# Patient Record
Sex: Male | Born: 1985 | Race: White | Hispanic: No | Marital: Single | Smoking: Never smoker
Health system: Southern US, Community
[De-identification: ages and names within clinical notes are randomized; demographics above are authoritative.]

---
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
## 2006-09-17 ENCOUNTER — Ambulatory Visit: Payer: Self-pay | Admitting: Internal Medicine

## 2007-01-09 ENCOUNTER — Emergency Department: Payer: Self-pay | Admitting: Emergency Medicine

## 2008-01-22 ENCOUNTER — Ambulatory Visit: Payer: Self-pay | Admitting: Internal Medicine

## 2008-03-13 ENCOUNTER — Ambulatory Visit: Payer: Self-pay | Admitting: Internal Medicine

## 2011-09-01 ENCOUNTER — Ambulatory Visit: Payer: Self-pay | Admitting: Internal Medicine

## 2012-01-30 ENCOUNTER — Ambulatory Visit: Payer: Self-pay | Admitting: Family Medicine

## 2012-02-09 ENCOUNTER — Ambulatory Visit: Payer: Self-pay | Admitting: Family Medicine

## 2014-01-31 ENCOUNTER — Ambulatory Visit: Payer: Self-pay

## 2014-01-31 LAB — RAPID INFLUENZA A&B ANTIGENS

## 2015-05-21 ENCOUNTER — Emergency Department
Admission: EM | Admit: 2015-05-21 | Discharge: 2015-05-21 | Disposition: A | Discharge status: Home / Self Care | Payer: Worker's Compensation | Attending: Emergency Medicine | Admitting: Emergency Medicine

## 2015-05-21 ENCOUNTER — Emergency Department: Payer: Worker's Compensation

## 2015-05-21 ENCOUNTER — Encounter: Payer: Self-pay | Admitting: Emergency Medicine

## 2015-05-21 DIAGNOSIS — W25XXXA Contact with sharp glass, initial encounter: Secondary | ICD-10-CM | POA: Diagnosis not present

## 2015-05-21 DIAGNOSIS — S61422A Laceration with foreign body of left hand, initial encounter: Secondary | ICD-10-CM | POA: Insufficient documentation

## 2015-05-21 DIAGNOSIS — M795 Residual foreign body in soft tissue: Secondary | ICD-10-CM

## 2015-05-21 DIAGNOSIS — Y9389 Activity, other specified: Secondary | ICD-10-CM | POA: Insufficient documentation

## 2015-05-21 DIAGNOSIS — Y929 Unspecified place or not applicable: Secondary | ICD-10-CM | POA: Diagnosis not present

## 2015-05-21 DIAGNOSIS — IMO0002 Reserved for concepts with insufficient information to code with codable children: Secondary | ICD-10-CM

## 2015-05-21 DIAGNOSIS — Y990 Civilian activity done for income or pay: Secondary | ICD-10-CM | POA: Diagnosis not present

## 2015-05-21 MED ORDER — BACITRACIN ZINC 500 UNIT/GM EX OINT
TOPICAL_OINTMENT | CUTANEOUS | Status: AC
  Filled 2015-05-21: qty 0.9

## 2015-05-21 MED ORDER — CEPHALEXIN 500 MG PO CAPS
500.0000 mg | ORAL_CAPSULE | Freq: Four times a day (QID) | ORAL | Status: AC
Start: 2015-05-21 — End: 2015-05-31

## 2015-05-21 MED ORDER — LIDOCAINE-EPINEPHRINE (PF) 1 %-1:200000 IJ SOLN
INTRAMUSCULAR | Status: AC
  Filled 2015-05-21: qty 30

## 2015-05-21 MED ORDER — MUPIROCIN 2 % EX OINT: TOPICAL_OINTMENT | CUTANEOUS | Status: DC

## 2015-05-21 NOTE — Discharge Instructions (Signed)

## 2015-05-21 NOTE — ED Notes (Signed)
Pt verbalized understanding of discharge instructions. NAD at this time. 

## 2015-05-21 NOTE — ED Provider Notes (Signed)
[facility] Emergency Department Provider Note     Time seen: ----------------------------------------- 8:57 AM on 05/21/2015 -----------------------------------------    I have reviewed the triage vital signs and the nursing notes.   HISTORY  Chief Complaint No chief complaint on file.    HPI Terry Rodriguez is a 30 y.o. male who presents the ER after sustaining a laceration during police activity. Patient states he was breaching a door which caused the door to shatter and glass struck him in the left hand. Last was noted to be sticking out of the hand earlier, EMS would not remove at the scene. He presents with bleeding from the left hand and obvious glass foreign body. Patient is only complaining of mild pain.   No past medical history on file.  There are no active problems to display for this patient.   No past surgical history on file.  Allergies Review of patient's allergies indicates not on file.  Social History Social History  Substance Use Topics  . Smoking status: Not on file  . Smokeless tobacco: Not on file  . Alcohol Use: Not on file    Review of Systems Constitutional: Negative for fever. Musculoskeletal: Positive for left hand pain Skin: Positive for left hand laceration Neurological: Positive for paresthesias dorsally in the left hand  ____________________________________________   PHYSICAL EXAM:  VITAL SIGNS: ED Triage Vitals  Enc Vitals Group     BP --      Pulse --      Resp --      Temp --      Temp src --      SpO2 --      Weight --      Height --      Head Cir --      Peak Flow --      Pain Score --      Pain Loc --      Pain Edu? --      Excl. in GC? --     Constitutional: Alert and oriented. Well appearing and in no distress. Musculoskeletal: Obvious laceration with foreign body present in a 3 cm laceration dorso-laterally in the left hand. Neurologic:  Paresthesias distally to the wound.  Normal motor function is appreciated Skin:  3 cm laceration is noted dorsally in the left hand. Psychiatric: Mood and affect are normal. Speech and behavior are normal. Patient exhibits appropriate insight and judgment. ____________________________________________  ED COURSE:  Pertinent labs & imaging results that were available during my care of the patient were reviewed by me and considered in my medical decision making (see chart for details). Patient with left hand laceration informed body. Patient will require laceration repair informed body removal. LACERATION REPAIR Performed by: Emily Filbert Authorized by: Daryel November E Consent: Verbal consent obtained. Risks and benefits: risks, benefits and alternatives were discussed Consent given by: patient Patient identity confirmed: provided demographic data Prepped and Draped in normal sterile fashion Wound explored with a large piece of glass removed initially.  Laceration Location: Left hand  Laceration Length: 3 cm  No Foreign Bodies seen or palpated  Anesthesia: local infiltration  Local anesthetic: lidocaine 1 % with epinephrine  Anesthetic total: 3 ml  Irrigation method: syringe Amount of cleaning: standard  Skin closure: 5-0 Prolene   Number of sutures: 10   Technique: Interrupted   Patient tolerance: Patient tolerated the procedure well with no immediate complications.  ____________________________________________   RADIOLOGY Images were viewed by me  Left hand x-rays IMPRESSION: Soft tissue injury without radiopaque foreign body or acute osseous abnormality identified. ____________________________________________  FINAL ASSESSMENT AND PLAN  Laceration with repair, foreign body removal  Plan: Patient with imaging as dictated above. X-rays unremarkable, foreign body was removed without any further foreign bodies identified. He'll be discharged with Keflex and advised to use topical  antibiotic on it. He is stable for discharge. Advise suture removal in 7-10 days   Emily FilbertWilliams, Kikue Gerhart E, MD   Emily FilbertJonathan E Kennith Morss, MD 05/21/15 [phone]

## 2015-05-21 NOTE — ED Notes (Signed)
[place] [employer] [facility] [place] laceration to left hand.

## 2015-09-24 ENCOUNTER — Ambulatory Visit
Admission: EM | Admit: 2015-09-24 | Discharge: 2015-09-24 | Disposition: A | Discharge status: Home / Self Care | Payer: 59 | Attending: Family Medicine | Admitting: Family Medicine

## 2015-09-24 DIAGNOSIS — J0100 Acute maxillary sinusitis, unspecified: Secondary | ICD-10-CM | POA: Diagnosis not present

## 2015-09-24 MED ORDER — LORATADINE-PSEUDOEPHEDRINE ER 5-120 MG PO TB12: 1.0000 | ORAL_TABLET | Freq: Two times a day (BID) | ORAL | Status: DC

## 2015-09-24 NOTE — ED Notes (Signed)
Patient complains of sinus pain and pressure, with cough, fatigue and chills. Patient states that pressure radiates in to jaw and face. Patient states that symptoms started 3 days ago.

## 2015-09-24 NOTE — Discharge Instructions (Signed)
Take medication as prescribed. Rest. Drink plenty of fluids.  ° °Follow up with your primary care physician this week as needed. Return to Urgent care for new or worsening concerns.  ° ° °Sinusitis, Adult °Sinusitis is redness, soreness, and inflammation of the paranasal sinuses. Paranasal sinuses are air pockets within the bones of your face. They are located beneath your eyes, in the middle of your forehead, and above your eyes. In healthy paranasal sinuses, mucus is able to drain out, and air is able to circulate through them by way of your nose. However, when your paranasal sinuses are inflamed, mucus and air can become trapped. This can allow bacteria and other germs to grow and cause infection. °Sinusitis can develop quickly and last only a short time (acute) or continue over a long period (chronic). Sinusitis that lasts for more than 12 weeks is considered chronic. °CAUSES °Causes of sinusitis include: °· Allergies. °· Structural abnormalities, such as displacement of the cartilage that separates your nostrils (deviated septum), which can decrease the air flow through your nose and sinuses and affect sinus drainage. °· Functional abnormalities, such as when the small hairs (cilia) that line your sinuses and help remove mucus do not work properly or are not present. °SIGNS AND SYMPTOMS °Symptoms of acute and chronic sinusitis are the same. The primary symptoms are pain and pressure around the affected sinuses. Other symptoms include: °· Upper toothache. °· Earache. °· Headache. °· Bad breath. °· Decreased sense of smell and taste. °· A cough, which worsens when you are lying flat. °· Fatigue. °· Fever. °· Thick drainage from your nose, which often is green and may contain pus (purulent). °· Swelling and warmth over the affected sinuses. °DIAGNOSIS °Your health care provider will perform a physical exam. During your exam, your health care provider may perform any of the following to help determine if you have  acute sinusitis or chronic sinusitis: °· Look in your nose for signs of abnormal growths in your nostrils (nasal polyps). °· Tap over the affected sinus to check for signs of infection. °· View the inside of your sinuses using an imaging device that has a light attached (endoscope). °If your health care provider suspects that you have chronic sinusitis, one or more of the following tests may be recommended: °· Allergy tests. °· Nasal culture. A sample of mucus is taken from your nose, sent to a lab, and screened for bacteria. °· Nasal cytology. A sample of mucus is taken from your nose and examined by your health care provider to determine if your sinusitis is related to an allergy. °TREATMENT °Most cases of acute sinusitis are related to a viral infection and will resolve on their own within 10 days. Sometimes, medicines are prescribed to help relieve symptoms of both acute and chronic sinusitis. These may include pain medicines, decongestants, nasal steroid sprays, or saline sprays. °However, for sinusitis related to a bacterial infection, your health care provider will prescribe antibiotic medicines. These are medicines that will help kill the bacteria causing the infection. °Rarely, sinusitis is caused by a fungal infection. In these cases, your health care provider will prescribe antifungal medicine. °For some cases of chronic sinusitis, surgery is needed. Generally, these are cases in which sinusitis recurs more than 3 times per year, despite other treatments. °HOME CARE INSTRUCTIONS °· Drink plenty of water. Water helps thin the mucus so your sinuses can drain more easily. °· Use a humidifier. °· Inhale steam 3-4 times a day (for example, sit in   the bathroom with the shower running). °· Apply a warm, moist washcloth to your face 3-4 times a day, or as directed by your health care provider. °· Use saline nasal sprays to help moisten and clean your sinuses. °· Take medicines only as directed by your health care  provider. °· If you were prescribed either an antibiotic or antifungal medicine, finish it all even if you start to feel better. °SEEK IMMEDIATE MEDICAL CARE IF: °· You have increasing pain or severe headaches. °· You have nausea, vomiting, or drowsiness. °· You have swelling around your face. °· You have vision problems. °· You have a stiff neck. °· You have difficulty breathing. °  °This information is not intended to replace advice given to you by your health care provider. Make sure you discuss any questions you have with your health care provider. °  °Document Released: 02/20/2005 Document Revised: 03/13/2014 Document Reviewed: 03/07/2011 °Elsevier Interactive Patient Education ©2016 [employer] ° °

## 2015-09-24 NOTE — ED Provider Notes (Signed)
[facility]  ____________________________________________  Time seen: Approximately 9:01 AM  I have reviewed the triage vital signs and the nursing notes.   HISTORY  Chief Complaint Sinusitis  HPI Terry Rodriguez is a 30 y.o. male presents with a complaint of right maxillary sinus pressure 2-3 days. Patient states he had a mild runny nose that has gotten better. Patient reports mild watery eyes and occasional cough. Denies postnasal drainage. States sinus pressure is very mild discomfort, and describes as congested feeling. Patient states he was concerned that he had a sinus infection.  Reports continues to eat and drink well. Denies fevers. Denies vision changes. Denies dental pain. Reports 30-year-old sick at home with GI symptoms. Denies other known sick contacts. States has a newborn at home and frequently has to wake up with newborn. Denies recent sickness. Denies chest pain, fatigue, fevers, shortness of breath, abdominal pain, dysuria, dizziness, headache, neck pain, back pain, weakness.  PCP: [facility]    History reviewed. No pertinent past medical history.  There are no active problems to display for this patient.   Past Surgical History  Procedure Laterality Date  . Ankle surgery Left 2004    Current Outpatient Rx  Name  Route  Sig  Dispense  Refill               Allergies Review of patient's allergies indicates no known allergies.  History reviewed. No pertinent family history.  Social History Social History  Substance Use Topics  . Smoking status: Never Smoker   . Smokeless tobacco: None  . Alcohol Use: No    Review of Systems Constitutional: No fever/chills Eyes: No visual changes. ENT: No sore throat.As above. Denies ear complaints.  Cardiovascular: Denies chest pain. Respiratory: Denies shortness of breath. Gastrointestinal: No abdominal pain.  No nausea, no vomiting.  No diarrhea.  No constipation. Genitourinary: Negative for  dysuria. Musculoskeletal: Negative for back pain. Skin: Negative for rash. Neurological: Negative for headaches, focal weakness or numbness.  10-point ROS otherwise negative.  ____________________________________________   PHYSICAL EXAM:  VITAL SIGNS: ED Triage Vitals  Enc Vitals Group     BP 09/24/15 0856 123/68 mmHg     Pulse Rate 09/24/15 0856 76     Resp 09/24/15 0856 17     Temp 09/24/15 0856 97.6 F (36.4 C)     Temp Source 09/24/15 0856 Tympanic     SpO2 09/24/15 0856 100 %     Weight 09/24/15 0856 226 lb (102.513 kg)     Height 09/24/15 0856 5\' 7"  (1.702 m)     Head Cir --      Peak Flow --      Pain Score 09/24/15 0857 2     Pain Loc --      Pain Edu? --      Excl. in GC? --    Constitutional: Alert and oriented. Well appearing and in no acute distress. Eyes: Conjunctivae are normal. PERRL. EOMI. Head: Atraumatic. Minimal right maxillary sinus tenderness to palpation, otherwise no sinus tenderness to palpation. No swelling. No erythema.  Ears: no erythema, normal TMs bilaterally.   Nose: Mild nasal congestion and nasal mucosal edema.  Mouth/Throat: Mucous membranes are moist. No pharyngeal erythema. No tonsillar swelling or exudate. No dental tenderness to palpation, no gum swelling noted.  Neck: No stridor.  No cervical spine tenderness to palpation. Hematological/Lymphatic/Immunilogical: No cervical lymphadenopathy. Cardiovascular: Normal rate, regular rhythm. Grossly normal heart sounds.  Good peripheral circulation. Respiratory: Normal respiratory effort.  No retractions. Lungs CTAB.No wheezes, rales or rhonchi. Good air movement.  Gastrointestinal: Soft and nontender.  Musculoskeletal: No lower or upper extremity tenderness nor edema. No cervical, thoracic or lumbar tenderness to palpation. Neurologic:  Normal speech and language. No gross focal neurologic deficits are appreciated. No gait instability. Skin:  Skin is warm, dry and intact. No rash  noted. Psychiatric: Mood and affect are normal. Speech and behavior are normal.  ____________________________________________   LABS (all labs ordered are listed, but only abnormal results are displayed)  Labs Reviewed - No data to display   INITIAL IMPRESSION / ASSESSMENT AND PLAN / ED COURSE  Pertinent labs & imaging results that were available during my care of the patient were reviewed by me and considered in my medical decision making (see chart for details).  Very well-appearing patient. No acute distress. Present for the complaints of 2-3 days of some right maxillary sinus congestion feeling in intermittent runny nose and cough. Very well-appearing patient. Suspect viral infection. Encouraged supportive treatments and use of oral Claritin-D. Encouraged rest, fluids and PCP follow-up as needed.  Discussed follow up with Primary care physician this week. Discussed follow up and return parameters including no resolution or any worsening concerns. Patient verbalized understanding and agreed to plan.   ____________________________________________   FINAL CLINICAL IMPRESSION(S) / ED DIAGNOSES  Final diagnoses:  Acute maxillary sinusitis, recurrence not specified     Discharge Medication List as of 09/24/2015  9:06 AM    START taking these medications   Details  loratadine-pseudoephedrine (CLARITIN-D 12 HOUR) 5-120 MG tablet Take 1 tablet by mouth 2 (two) times daily., Starting 09/24/2015, Until Discontinued, Normal        Note: This dictation was prepared with Dragon dictation along with smaller phrase technology. Any transcriptional errors that result from this process are unintentional.       Renford Dills, NP 09/24/15 [phone]

## 2016-03-03 ENCOUNTER — Ambulatory Visit
Admission: EM | Admit: 2016-03-03 | Discharge: 2016-03-03 | Disposition: A | Discharge status: Home / Self Care | Payer: 59 | Attending: Family Medicine | Admitting: Family Medicine

## 2016-03-03 DIAGNOSIS — R6889 Other general symptoms and signs: Secondary | ICD-10-CM | POA: Diagnosis not present

## 2016-03-03 LAB — RAPID INFLUENZA A&B ANTIGENS (ARMC ONLY): INFLUENZA B (ARMC): NEGATIVE

## 2016-03-03 LAB — RAPID INFLUENZA A&B ANTIGENS: Influenza A (ARMC): NEGATIVE

## 2016-03-03 MED ORDER — OSELTAMIVIR PHOSPHATE 75 MG PO CAPS: 75.0000 mg | ORAL_CAPSULE | Freq: Two times a day (BID) | ORAL | 0 refills | Status: DC

## 2016-03-03 MED ORDER — FEXOFENADINE-PSEUDOEPHED ER 180-240 MG PO TB24: 1.0000 | ORAL_TABLET | Freq: Every day | ORAL | 0 refills | Status: DC

## 2016-03-03 MED ORDER — BENZONATATE 200 MG PO CAPS: 200.0000 mg | ORAL_CAPSULE | Freq: Three times a day (TID) | ORAL | 0 refills | Status: DC | PRN

## 2016-03-03 NOTE — ED Triage Notes (Signed)
Pt c/o sinus pressure, generalized body aches, ear pain, and fever over night.

## 2016-03-03 NOTE — ED Provider Notes (Signed)
[facility]    CSN: [id number] Arrival date & time: 03/03/16  1017     History   Chief Complaint Chief Complaint  Patient presents with  . Influenza    HPI Terry Rodriguez is a 30 y.o. male.   Patient's here because of flulike symptoms. He states everything started yesterday. Temperature 90.9. He did not get a flu shot this year. He reports was surgery was knee surgery. As a drug allergy to one of the anesthesia medications. He does not smoke. No sniffing family medical history present alone to today's visit. He reports aching all over some coughing and some congestion and some mild irritation of his throat.   The history is provided by the patient. No language interpreter was used.  Influenza  Presenting symptoms: cough, fatigue, fever, myalgias and rhinorrhea   Presenting symptoms: no diarrhea, no headaches, no nausea, no shortness of breath and no sore throat   Severity:  Moderate Onset quality:  Sudden Duration:  2 days Progression:  Worsening Chronicity:  New Relieved by:  Nothing Worsened by:  Nothing   History reviewed. No pertinent past medical history.  There are no active problems to display for this patient.   Past Surgical History:  Procedure Laterality Date  . ANKLE SURGERY Left 2004       Home Medications    Prior to Admission medications   Medication Sig Start Date End Date Taking? Authorizing Provider  benzonatate (TESSALON) 200 MG capsule Take 1 capsule (200 mg total) by mouth 3 (three) times daily as needed. 03/03/16   Hassan RowanEugene Sinda Leedom, MD  fexofenadine-pseudoephedrine (ALLEGRA-D ALLERGY & CONGESTION) 180-240 MG 24 hr tablet Take 1 tablet by mouth daily. 03/03/16   Hassan RowanEugene Ashawn Rinehart, MD  oseltamivir (TAMIFLU) 75 MG capsule Take 1 capsule (75 mg total) by mouth 2 (two) times daily. 03/03/16   Hassan RowanEugene Emmerson Shuffield, MD    Family History History reviewed. No pertinent family history.  Social History Social History  Substance Use Topics  .  Smoking status: Never Smoker  . Smokeless tobacco: Never Used  . Alcohol use No     Allergies   Patient has no known allergies.   Review of Systems Review of Systems  Constitutional: Positive for fatigue and fever.  HENT: Positive for rhinorrhea. Negative for sore throat.   Respiratory: Positive for cough. Negative for shortness of breath.   Gastrointestinal: Negative for diarrhea and nausea.  Musculoskeletal: Positive for myalgias.  Neurological: Negative for headaches.  All other systems reviewed and are negative.    Physical Exam Triage Vital Signs ED Triage Vitals  Enc Vitals Group     BP 03/03/16 1033 122/77     Pulse Rate 03/03/16 1033 98     Resp 03/03/16 1033 18     Temp 03/03/16 1033 99.5 F (37.5 C)     Temp Source 03/03/16 1033 Oral     SpO2 03/03/16 1033 99 %     Weight 03/03/16 1033 235 lb (106.6 kg)     Height 03/03/16 1033 5\' 7"  (1.702 m)     Head Circumference --      Peak Flow --      Pain Score 03/03/16 1035 5     Pain Loc --      Pain Edu? --      Excl. in GC? --    No data found.   Updated Vital Signs BP 122/77 (BP Location: Left Arm)   Pulse 98   Temp 99.5 F (37.5 C) (  Oral)   Resp 18   Ht 5\' 7"  (1.702 m)   Wt 235 lb (106.6 kg)   SpO2 99%   BMI 36.81 kg/m   Visual Acuity Right Eye Distance:   Left Eye Distance:   Bilateral Distance:    Right Eye Near:   Left Eye Near:    Bilateral Near:     Physical Exam  Constitutional: He is oriented to person, place, and time. He appears well-developed.  HENT:  Head: Normocephalic and atraumatic.  Eyes: EOM are normal. Pupils are equal, round, and reactive to light.  Neck: Normal range of motion. Neck supple.  Cardiovascular: Normal rate and regular rhythm.   Pulmonary/Chest: Effort normal.  Musculoskeletal: Normal range of motion. He exhibits no edema or deformity.  Lymphadenopathy:    He has cervical adenopathy.  Neurological: He is alert and oriented to person, place, and time.    Skin: Skin is warm and dry.  Psychiatric: He has a normal mood and affect.  Vitals reviewed.    UC Treatments / Results  Labs (all labs ordered are listed, but only abnormal results are displayed) Labs Reviewed  RAPID INFLUENZA A&B ANTIGENS ([facility] ONLY)    EKG  EKG Interpretation None       Radiology No results found.  Procedures Procedures (including critical care time)  Medications Ordered in UC Medications - No data to display   Initial Impression / Assessment and Plan / UC Course  I have reviewed the triage vital signs and the nursing notes.  Pertinent labs & imaging results that were available during my care of the patient were reviewed by me and considered in my medical decision making (see chart for details).  Clinical Course     Patient's flu test was negative because symptoms are consistent with flu will treat with Tamiflu 75 mg twice a day and Tessalon Perles 200 mg 1 tablet 3 times a day and Allegra-D 24 hours 1 tablet daily. He can get Aleve or Motrin over-the-counter. Follow-up with PCP if not better by Tuesday. He is off work since declined work note this time.   Final Clinical Impressions(s) / UC Diagnoses   Final diagnoses:  Flu-like symptoms    New Prescriptions New Prescriptions   BENZONATATE (TESSALON) 200 MG CAPSULE    Take 1 capsule (200 mg total) by mouth 3 (three) times daily as needed.   FEXOFENADINE-PSEUDOEPHEDRINE (ALLEGRA-D ALLERGY & CONGESTION) 180-240 MG 24 HR TABLET    Take 1 tablet by mouth daily.   OSELTAMIVIR (TAMIFLU) 75 MG CAPSULE    Take 1 capsule (75 mg total) by mouth 2 (two) times daily.    Note: This dictation was prepared with Dragon dictation along with smaller phrase technology. Any transcriptional errors that result from this process are unintentional.   Hassan RowanEugene Aleyda Gindlesperger, MD 03/03/16 1210

## 2016-08-23 IMAGING — DX DG HAND COMPLETE 3+V*L*
3 series · 3 of 3 positions shown · non-contrast
Comparison: None.

CLINICAL DATA: Laceration to the top of the hand due to shattered
glass. Foreign body removed. Initial encounter.

EXAM:
LEFT HAND - COMPLETE 3+ VIEW

[hand ap]
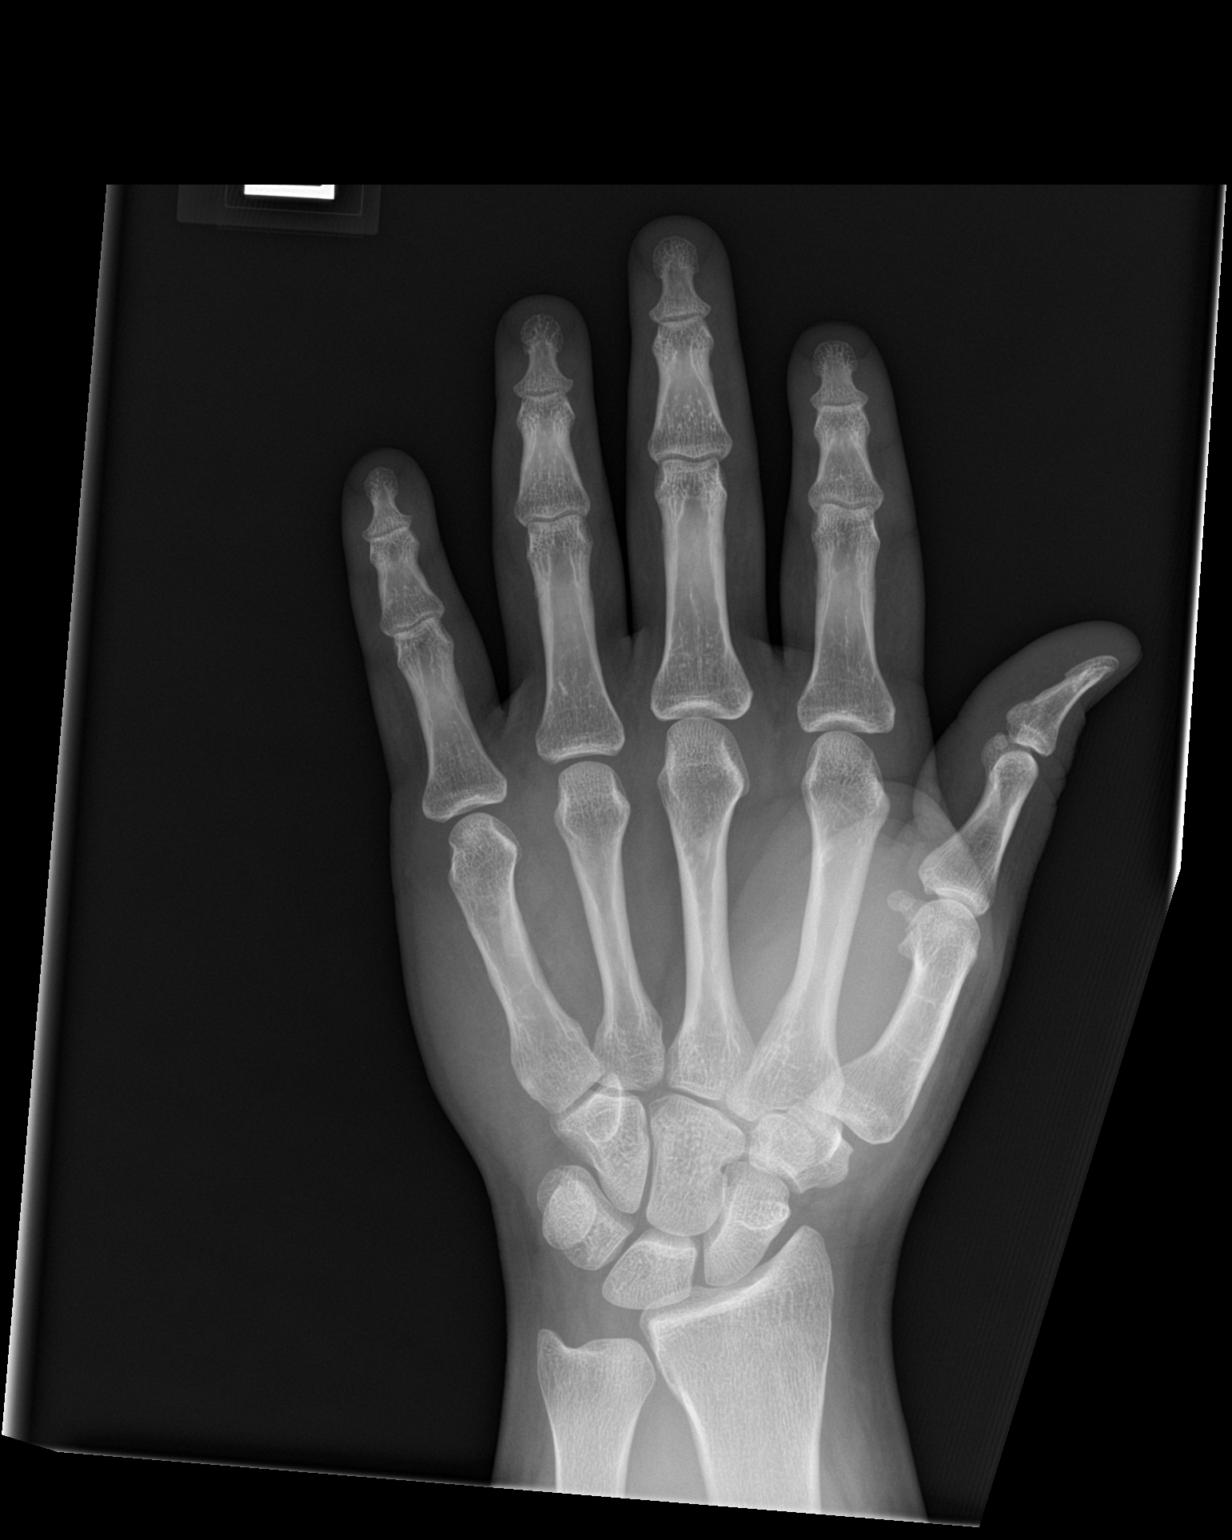

[hand obl]
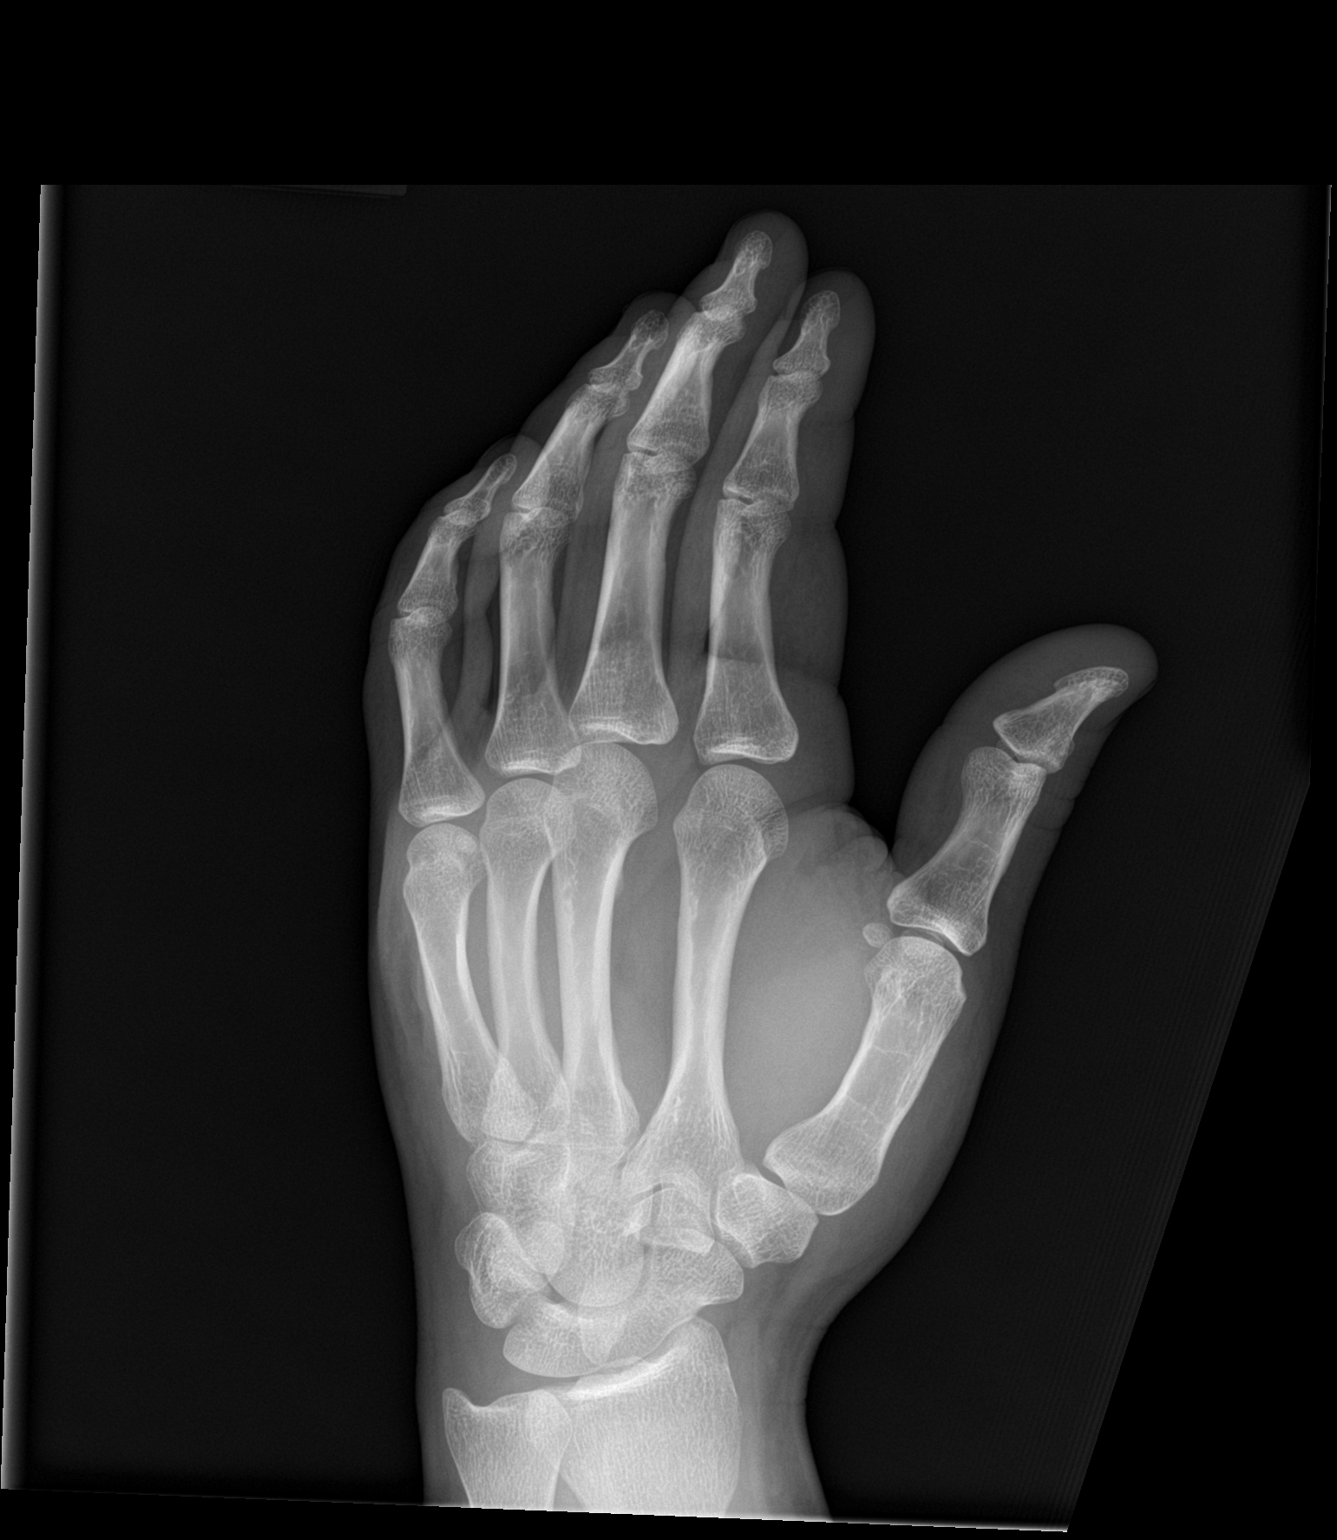

[hand lat]
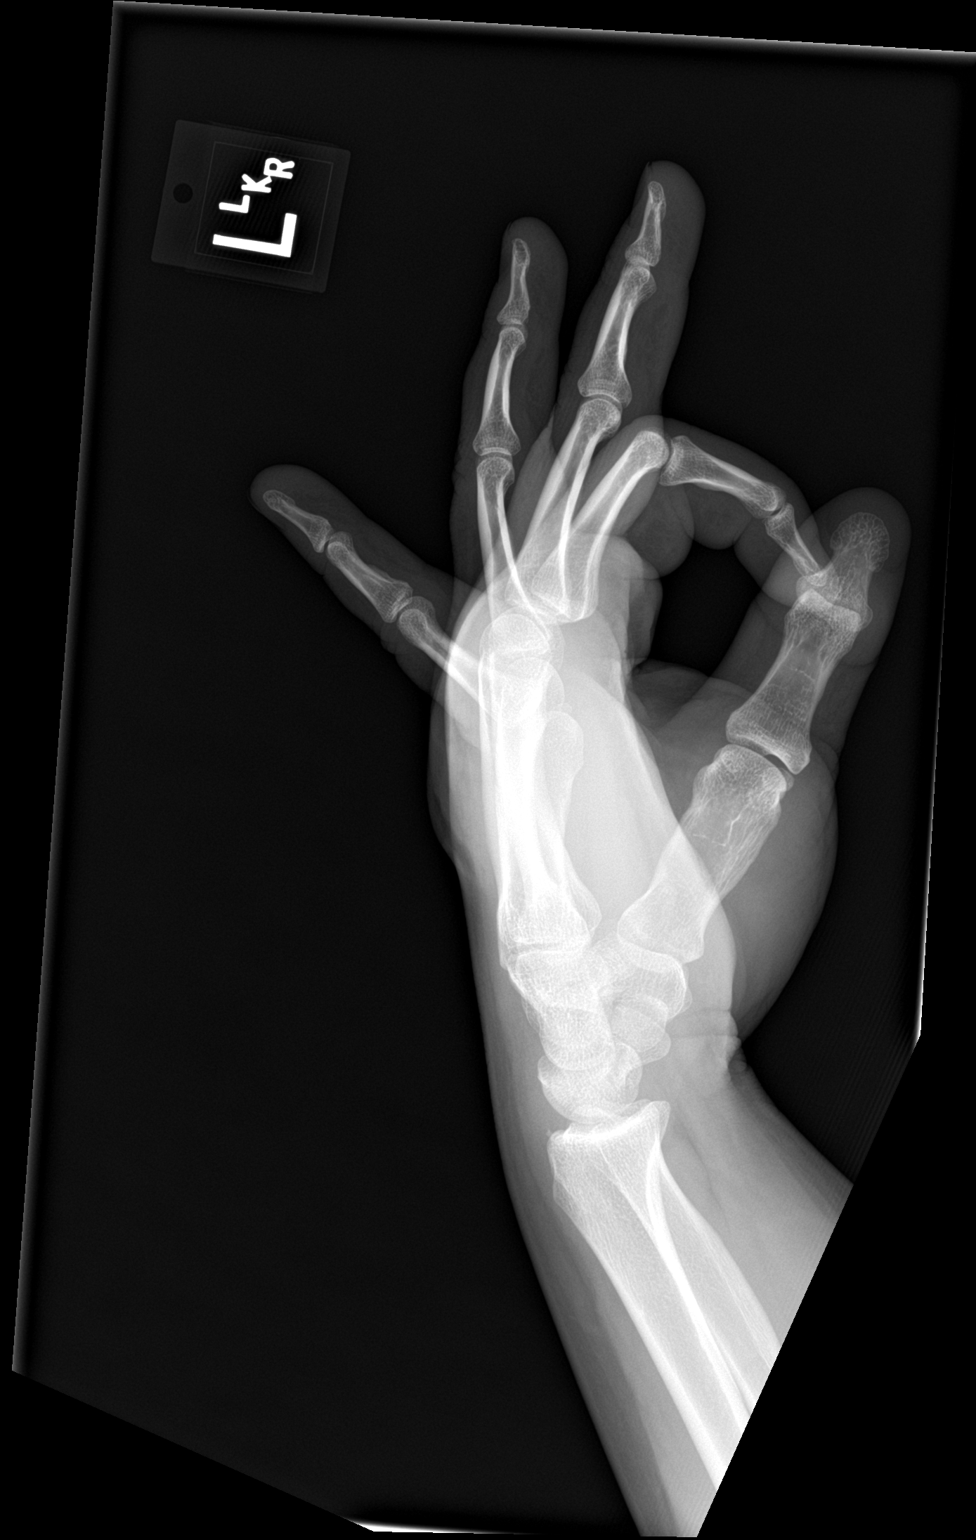

[3 of 3 positions shown; findings below may reference images not displayed]

FINDINGS: There is evidence of soft tissue injury involving the dorsal, ulnar
aspect of the hand. No radiopaque foreign body is identified. No
fracture or dislocation is seen. Bone mineralization appears normal.
IMPRESSION: Soft tissue injury without radiopaque foreign body or acute osseous
abnormality identified.

## 2017-03-13 DIAGNOSIS — Z8709 Personal history of other diseases of the respiratory system: Secondary | ICD-10-CM | POA: Diagnosis not present

## 2017-03-13 DIAGNOSIS — J019 Acute sinusitis, unspecified: Secondary | ICD-10-CM | POA: Diagnosis not present

## 2017-03-13 DIAGNOSIS — B9689 Other specified bacterial agents as the cause of diseases classified elsewhere: Secondary | ICD-10-CM | POA: Diagnosis not present

## 2017-09-19 DIAGNOSIS — H109 Unspecified conjunctivitis: Secondary | ICD-10-CM | POA: Diagnosis not present

## 2017-10-22 DIAGNOSIS — M549 Dorsalgia, unspecified: Secondary | ICD-10-CM | POA: Diagnosis not present

## 2017-10-22 DIAGNOSIS — M5136 Other intervertebral disc degeneration, lumbar region: Secondary | ICD-10-CM | POA: Diagnosis not present

## 2017-10-22 DIAGNOSIS — M545 Low back pain: Secondary | ICD-10-CM | POA: Diagnosis not present

## 2017-10-23 DIAGNOSIS — G8929 Other chronic pain: Secondary | ICD-10-CM | POA: Diagnosis not present

## 2017-10-23 DIAGNOSIS — M545 Low back pain: Secondary | ICD-10-CM | POA: Diagnosis not present

## 2017-11-14 DIAGNOSIS — S39012A Strain of muscle, fascia and tendon of lower back, initial encounter: Secondary | ICD-10-CM | POA: Diagnosis not present

## 2017-11-14 DIAGNOSIS — M545 Low back pain: Secondary | ICD-10-CM | POA: Diagnosis not present

## 2017-11-21 DIAGNOSIS — S39012D Strain of muscle, fascia and tendon of lower back, subsequent encounter: Secondary | ICD-10-CM | POA: Diagnosis not present

## 2017-11-21 DIAGNOSIS — M5441 Lumbago with sciatica, right side: Secondary | ICD-10-CM | POA: Diagnosis not present

## 2017-12-04 DIAGNOSIS — M5441 Lumbago with sciatica, right side: Secondary | ICD-10-CM | POA: Diagnosis not present

## 2017-12-04 DIAGNOSIS — S39012D Strain of muscle, fascia and tendon of lower back, subsequent encounter: Secondary | ICD-10-CM | POA: Diagnosis not present

## 2018-02-09 DIAGNOSIS — Z23 Encounter for immunization: Secondary | ICD-10-CM | POA: Diagnosis not present

## 2018-02-09 DIAGNOSIS — S0181XA Laceration without foreign body of other part of head, initial encounter: Secondary | ICD-10-CM | POA: Diagnosis not present

## 2018-02-21 ENCOUNTER — Other Ambulatory Visit
Admission: RE | Admit: 2018-02-21 | Discharge: 2018-02-21 | Disposition: A | Discharge status: Home / Self Care | Payer: 59 | Attending: Emergency Medicine | Admitting: Emergency Medicine

## 2018-02-21 NOTE — ED Notes (Signed)
Pt arrived via POV requesting a UDS due to an accident at work with [place] [employer]. This EDT went through paperwork and then completed the procedure with the pt. No complications occurred.

## 2018-03-31 ENCOUNTER — Ambulatory Visit
Admission: EM | Admit: 2018-03-31 | Discharge: 2018-03-31 | Disposition: A | Discharge status: Home / Self Care | Payer: 59 | Attending: Family Medicine | Admitting: Family Medicine

## 2018-03-31 ENCOUNTER — Other Ambulatory Visit: Payer: Self-pay

## 2018-03-31 DIAGNOSIS — H6982 Other specified disorders of Eustachian tube, left ear: Secondary | ICD-10-CM | POA: Insufficient documentation

## 2018-03-31 MED ORDER — AZELASTINE-FLUTICASONE 137-50 MCG/ACT NA SUSP: NASAL | 0 refills | Status: AC

## 2018-03-31 NOTE — ED Triage Notes (Signed)
Left ear has felt muffled off and on past few days. Pt states he didn't have his earplugs in when he was hunting. No wax impaction seen in triage.

## 2018-03-31 NOTE — ED Provider Notes (Signed)
[facility]    CSN: [id number] Arrival date & time: 03/31/18  1007  History   Chief Complaint Chief Complaint  Patient presents with  . Ear Problem   HPI  33 year old male presents with the above complaint.  Patient reports that he has had muffled hearing/tinnitus in his left ear since yesterday.  Worsened today.  Patient states that he has chronic issues regarding his sinuses.  No reports of current symptoms of sinusitis.  No fever.  No reports of trauma to the ear.  No known exacerbating or relieving factors.  No other associated symptoms.  No other complaints.  Social Hx reviewed as below. Social History Social History   Tobacco Use  . Smoking status: Never Smoker  . Smokeless tobacco: Never Used  Substance Use Topics  . Alcohol use: No    Alcohol/week: 0.0 standard drinks  . Drug use: No    Allergies   Patient has no known allergies.   Review of Systems Review of Systems  Constitutional: Negative.   HENT: Positive for hearing loss and tinnitus.    Physical Exam Triage Vital Signs ED Triage Vitals  Enc Vitals Group     BP 03/31/18 1025 127/78     Pulse Rate 03/31/18 1025 66     Resp 03/31/18 1025 16     Temp 03/31/18 1025 98.4 F (36.9 C)     Temp Source 03/31/18 1025 Oral     SpO2 03/31/18 1025 100 %     Weight 03/31/18 1027 230 lb (104.3 kg)     Height 03/31/18 1027 5\' 7"  (1.702 m)     Head Circumference --      Peak Flow --      Pain Score 03/31/18 1027 0     Pain Loc --      Pain Edu? --      Excl. in GC? --    Updated Vital Signs BP 127/78 (BP Location: Left Arm)   Pulse 66   Temp 98.4 F (36.9 C) (Oral)   Resp 16   Ht 5\' 7"  (1.702 m)   Wt 104.3 kg   SpO2 100%   BMI 36.02 kg/m   Visual Acuity Right Eye Distance:   Left Eye Distance:   Bilateral Distance:    Right Eye Near:   Left Eye Near:    Bilateral Near:     Physical Exam Vitals signs and nursing note reviewed.  Constitutional:      General: He is not in acute  distress. HENT:     Head: Normocephalic and atraumatic.     Right Ear: Tympanic membrane normal.     Left Ear: Tympanic membrane normal.     Nose: Nose normal. No rhinorrhea.  Eyes:     General:        Right eye: No discharge.        Left eye: No discharge.     Conjunctiva/sclera: Conjunctivae normal.  Cardiovascular:     Rate and Rhythm: Normal rate and regular rhythm.  Pulmonary:     Effort: Pulmonary effort is normal.     Breath sounds: Normal breath sounds.  Neurological:     Mental Status: He is alert.  Psychiatric:        Mood and Affect: Mood normal.        Behavior: Behavior normal.    UC Treatments / Results  Labs (all labs ordered are listed, but only abnormal results are displayed) Labs Reviewed - No data to display  EKG None  Radiology No results found.  Procedures Procedures (including critical care time)  Medications Ordered in UC Medications - No data to display  Initial Impression / Assessment and Plan / UC Course  I have reviewed the triage vital signs and the nursing notes.  Pertinent labs & imaging results that were available during my care of the patient were reviewed by me and considered in my medical decision making (see chart for details).    33 year old male presents with eustachian tube dysfunction.  No evidence of otitis media.  Treating with Dymista.  Final Clinical Impressions(s) / UC Diagnoses   Final diagnoses:  Dysfunction of left eustachian tube   Discharge Instructions   None    ED Prescriptions    Medication Sig Dispense Auth. Provider   Azelastine-Fluticasone 137-50 MCG/ACT SUSP 1 spray in each nostril daily. 23 g Tommie Sams, DO     Controlled Substance Prescriptions  Controlled Substance Registry consulted? Not Applicable   Tommie Sams, DO 03/31/18 1144

## 2018-09-30 ENCOUNTER — Other Ambulatory Visit: Payer: Self-pay

## 2018-09-30 DIAGNOSIS — Z20822 Contact with and (suspected) exposure to covid-19: Secondary | ICD-10-CM

## 2018-10-02 LAB — NOVEL CORONAVIRUS, NAA: SARS-CoV-2, NAA: NOT DETECTED

## 2018-11-20 ENCOUNTER — Ambulatory Visit: Payer: Self-pay | Admitting: General Surgery

## 2018-11-20 NOTE — H&P (Signed)
PATIENT PROFILE: Terry Rodriguez is a 33 y.o. male who presents to the Clinic for consultation at the request of Dr. Kym Groom for evaluation of left inguinal hernia.  PCP:  Valera Castle, MD  HISTORY OF PRESENT ILLNESS: Terry Rodriguez reports having a left inguinal hernia since 6 to 8 months ago.  He reported the beginning it was not to be but in the last 3 months he has been getting bigger and more uncomfortable.  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  He reports that the pain radiates to the left testicle.  Aggravating factors are heavy lifting.  There is no alleviating factor.  Denies any abdominal distention, nausea or vomiting.  Denies constipation.  Denies fever or chills.   PROBLEM LIST:         Problem List  Date Reviewed: 06/19/2018         Noted   Strain of lumbar region 11/14/2017   Obesity Unknown   Seasonal allergies Unknown      GENERAL REVIEW OF SYSTEMS:   General ROS: negative for - chills, fatigue, fever, weight gain or weight loss Allergy and Immunology ROS: negative for - hives  Hematological and Lymphatic ROS: negative for - bleeding problems or bruising, negative for palpable nodes Endocrine ROS: negative for - heat or cold intolerance, hair changes Respiratory ROS: negative for - cough, shortness of breath or wheezing Cardiovascular ROS: no chest pain or palpitations GI ROS: negative for nausea, vomiting, abdominal pain, diarrhea, constipation Musculoskeletal ROS: negative for - joint swelling or muscle pain Neurological ROS: negative for - confusion, syncope Dermatological ROS: negative for pruritus and rash Psychiatric: negative for anxiety, depression, difficulty sleeping and memory loss  MEDICATIONS: Current Medications        Current Outpatient Medications  Medication Sig Dispense Refill  . EPINEPHrine (EPIPEN) 0.3 mg/0.3 mL pen injector USE AS DIRECTED  99   No current  facility-administered medications for this visit.       ALLERGIES: Patient has no known allergies.  PAST MEDICAL HISTORY:     Past Medical History:  Diagnosis Date  . Obesity   . Seasonal allergies     PAST SURGICAL HISTORY:      Past Surgical History:  Procedure Laterality Date  . Ankle surgery Left   . Hand surgery Right   . Wisdom teeth surgery       FAMILY HISTORY:      Family History  Problem Relation Age of Onset  . No Known Problems Mother   . No Known Problems Father   . No Known Problems Brother   . No Known Problems Maternal Grandmother   . Alcohol abuse Paternal Grandmother   . Cirrhosis Paternal Grandmother   . No Known Problems Paternal Grandfather   . No Known Problems Brother      SOCIAL HISTORY: Social History          Socioeconomic History  . Marital status: Married    Spouse name: Janett Billow  . Number of children: Not on file  . Years of education: 44  . Highest education level: Not on file  Occupational History  . Occupation: Engineer, structural  Social Needs  . Financial resource strain: Not on file  . Food insecurity    Worry: Not on file    Inability: Not on file  . Transportation needs    Medical: Not on file    Non-medical: Not on file  Tobacco Use  . Smoking status: Never Smoker  . Smokeless tobacco: Never Used  Substance and Sexual Activity  . Alcohol use: No  . Drug use: No  . Sexual activity: Yes    Partners: Female    Birth control/protection: None  Other Topics Concern  . Not on file  Social History Narrative   The patient is a Engineer, structural, he lives with his wife who is 8 months pregnant (November, 2014).  No alcohol use, no tobacco use.  Physically active.      PHYSICAL EXAM:    Vitals:   11/15/18 1016  BP: 112/86  Pulse: 90   Body mass index is 37.31 kg/m. Weight: (!) 108 kg (238 lb 3.2 oz)   GENERAL: Alert, active, oriented x3  HEENT: Pupils equal reactive to  light. Extraocular movements are intact. Sclera clear. Palpebral conjunctiva normal red color.Pharynx clear.  NECK: Supple with no palpable mass and no adenopathy.  LUNGS: Sound clear with no rales rhonchi or wheezes.  HEART: Regular rhythm S1 and S2 without murmur.  ABDOMEN: Soft and depressible, nontender with no palpable mass, no hepatomegaly.  Left inguinal hernia, reducible.  Nontender.  EXTREMITIES: Well-developed well-nourished symmetrical with no dependent edema.  NEUROLOGICAL: Awake alert oriented, facial expression symmetrical, moving all extremities.  REVIEW OF DATA: I have reviewed the following data today:      No visits with results within 3 Month(s) from this visit.  Latest known visit with results is:  Office Visit on 06/19/2018  Component Date Value  . WBC (White Blood Cell Co* 06/19/2018 9.5   . Hemoglobin 06/19/2018 14.3   . Hematocrit 06/19/2018 42.1   . Platelets 06/19/2018 339   . MCV (Mean Corpuscular Vo* 06/19/2018 81   . MCH (Mean Corpuscular He* 06/19/2018 27.6   . MCHC (Mean Corpuscular H* 06/19/2018 34.0   . RBC (Red Blood Cell Coun* 06/19/2018 5.18   . RDW-CV (Red Cell Distrib* 06/19/2018 13.3   . MPV (Mean Platelet Volum* 06/19/2018 8.9   . Neutrophil Count 06/19/2018 6.5   . Neutrophil % 06/19/2018 67.7   . Lymphocyte Count 06/19/2018 2.2   . Lymphocyte % 06/19/2018 23.4   . Monocyte Count 06/19/2018 0.7   . Monocyte % 06/19/2018 7.4   . Eosinophil Count 06/19/2018 0.11   . Eosinophil % 06/19/2018 1.2   . Basophil Count 06/19/2018 0.03   . Basophil % 06/19/2018 0.3   . Sodium 06/19/2018 139   . Potassium 06/19/2018 4.0   . Chloride 06/19/2018 106   . Carbon Dioxide (CO2) 06/19/2018 26   . Urea Nitrogen (BUN) 06/19/2018 14   . Creatinine 06/19/2018 1.1   . Glucose 06/19/2018 94   . Calcium 06/19/2018 9.4   . AST (Aspartate Aminotran* 06/19/2018 26   . ALT (Alanine Aminotransf* 06/19/2018 25   . Bilirubin, Total 06/19/2018 0.8    . Alk Phos (Alkaline Phosp* 06/19/2018 67   . Albumin 06/19/2018 4.2   . Protein, Total 06/19/2018 7.7   . Anion Gap 06/19/2018 7   . BUN/CREA Ratio 06/19/2018 13   . Glomerular Filtration Ra* 06/19/2018 88   . Thyroid Stimulating Horm* 06/19/2018 0.91   . Thyroxine, Free (FT4) 06/19/2018 0.98      ASSESSMENT: Terry Rodriguez is a 33 y.o. male presenting for consultation for left inguinal hernia.    The patient presents with a symptomatic, reducible inguinal hernia. Patient was oriented about the diagnosis of inguinal hernia and its implication. The patient was oriented about the  treatment alternatives (observation vs surgical repair). Due to patient symptoms, repair is recommended. Patient oriented about the surgical procedure, the use of mesh and its risk of complications such as: infection, bleeding, injury to vas deference, vasculature and testicle, injury to bowel or bladder, and chronic pain.  Oriented about the alternative of open and minimally invasive repair technique.  Patient preferred to proceed with minimally invasive technique.  Non-recurrent unilateral inguinal hernia without obstruction or gangrene [K40.90]  PLAN: 1. Robotic left inguinal hernia repair with mesh 2. CBC, CMP 3. Avoid taking aspirin 5 days before surgery 4. Will contact you next week to confirm surgery date  Patient verbalized understanding, all questions were answered, and were agreeable with the plan outlined above.    Herbert Pun, MD  Electronically signed by Herbert Pun, MD

## 2018-12-16 ENCOUNTER — Inpatient Hospital Stay
Admission: RE | Admit: 2018-12-16 | Discharge: 2018-12-16 | Disposition: A | Discharge status: Home / Self Care | Payer: 59 | Source: Ambulatory Visit

## 2018-12-17 ENCOUNTER — Other Ambulatory Visit: Payer: Self-pay

## 2018-12-17 ENCOUNTER — Encounter
Admission: RE | Admit: 2018-12-17 | Discharge: 2018-12-17 | Disposition: A | Discharge status: Home / Self Care | Payer: 59 | Source: Ambulatory Visit | Attending: General Surgery | Admitting: General Surgery

## 2018-12-17 NOTE — Pre-Procedure Instructions (Signed)
Patient states "after talking with primary care physician, I've decided to cancel surgery."   Informed to contact Dr Windell Moment.

## 2018-12-19 ENCOUNTER — Other Ambulatory Visit: Admission: RE | Admit: 2018-12-19 | Payer: 59 | Source: Ambulatory Visit

## 2018-12-23 ENCOUNTER — Encounter: Admission: RE | Payer: Self-pay | Source: Home / Self Care

## 2018-12-23 ENCOUNTER — Ambulatory Visit: Admission: RE | Admit: 2018-12-23 | Payer: 59 | Source: Home / Self Care | Admitting: General Surgery

## 2018-12-23 SURGERY — REPAIR, HERNIA, INGUINAL, ROBOT-ASSISTED, LAPAROSCOPIC, USING MESH
Anesthesia: General | Site: Groin | Laterality: Left
